# Patient Record
Sex: Male | Born: 1955 | Race: White | Hispanic: No | Marital: Married
Health system: Southern US, Community
[De-identification: ages and names within clinical notes are randomized; demographics above are authoritative.]

---

## 2005-10-16 ENCOUNTER — Ambulatory Visit: Payer: Self-pay | Admitting: Chiropractor

## 2006-09-06 ENCOUNTER — Emergency Department: Payer: Self-pay

## 2006-12-28 ENCOUNTER — Ambulatory Visit: Payer: Self-pay | Admitting: Urology

## 2007-07-06 ENCOUNTER — Ambulatory Visit: Payer: Self-pay | Admitting: Urology

## 2008-02-21 ENCOUNTER — Ambulatory Visit: Payer: Self-pay | Admitting: Gastroenterology

## 2008-10-23 ENCOUNTER — Ambulatory Visit: Payer: Self-pay

## 2011-09-12 ENCOUNTER — Emergency Department: Payer: Self-pay | Admitting: Emergency Medicine

## 2011-11-05 ENCOUNTER — Ambulatory Visit: Payer: Self-pay | Admitting: Otolaryngology

## 2011-11-05 LAB — CREATININE, SERUM: Creatinine: 1.12 mg/dL (ref 0.60–1.30)

## 2012-01-07 ENCOUNTER — Ambulatory Visit: Payer: Self-pay | Admitting: Neurology

## 2012-05-26 ENCOUNTER — Ambulatory Visit: Payer: Self-pay | Admitting: Urology

## 2012-06-03 ENCOUNTER — Ambulatory Visit: Payer: Self-pay | Admitting: Urology

## 2014-11-08 ENCOUNTER — Other Ambulatory Visit: Payer: Self-pay | Admitting: Medical

## 2014-11-08 DIAGNOSIS — R413 Other amnesia: Secondary | ICD-10-CM

## 2014-11-08 DIAGNOSIS — D6859 Other primary thrombophilia: Secondary | ICD-10-CM

## 2014-11-14 ENCOUNTER — Ambulatory Visit
Admission: RE | Admit: 2014-11-14 | Discharge: 2014-11-14 | Disposition: A | Discharge status: Home / Self Care | Payer: BLUE CROSS/BLUE SHIELD | Source: Ambulatory Visit | Attending: Medical | Admitting: Medical

## 2014-11-14 DIAGNOSIS — D6859 Other primary thrombophilia: Secondary | ICD-10-CM | POA: Insufficient documentation

## 2014-11-14 DIAGNOSIS — J322 Chronic ethmoidal sinusitis: Secondary | ICD-10-CM | POA: Insufficient documentation

## 2014-11-14 DIAGNOSIS — J320 Chronic maxillary sinusitis: Secondary | ICD-10-CM | POA: Diagnosis not present

## 2014-11-14 DIAGNOSIS — R413 Other amnesia: Secondary | ICD-10-CM | POA: Insufficient documentation

## 2015-10-31 ENCOUNTER — Other Ambulatory Visit: Payer: Self-pay | Admitting: Neurology

## 2015-12-10 ENCOUNTER — Other Ambulatory Visit: Payer: Self-pay | Admitting: Neurology

## 2016-01-03 ENCOUNTER — Other Ambulatory Visit: Payer: Self-pay | Admitting: Neurology

## 2016-01-03 DIAGNOSIS — R911 Solitary pulmonary nodule: Secondary | ICD-10-CM

## 2016-01-03 DIAGNOSIS — R918 Other nonspecific abnormal finding of lung field: Secondary | ICD-10-CM

## 2016-01-13 ENCOUNTER — Ambulatory Visit: Payer: BLUE CROSS/BLUE SHIELD

## 2016-01-13 ENCOUNTER — Ambulatory Visit
Admission: RE | Admit: 2016-01-13 | Discharge: 2016-01-13 | Disposition: A | Discharge status: Home / Self Care | Payer: BLUE CROSS/BLUE SHIELD | Source: Ambulatory Visit | Attending: Neurology | Admitting: Neurology

## 2016-01-13 DIAGNOSIS — R918 Other nonspecific abnormal finding of lung field: Secondary | ICD-10-CM | POA: Diagnosis not present

## 2016-01-13 DIAGNOSIS — I712 Thoracic aortic aneurysm, without rupture: Secondary | ICD-10-CM | POA: Diagnosis not present

## 2016-01-13 DIAGNOSIS — R911 Solitary pulmonary nodule: Secondary | ICD-10-CM

## 2017-07-09 ENCOUNTER — Other Ambulatory Visit: Payer: Self-pay | Admitting: Specialist

## 2017-07-09 DIAGNOSIS — R918 Other nonspecific abnormal finding of lung field: Secondary | ICD-10-CM

## 2017-07-19 ENCOUNTER — Ambulatory Visit: Payer: BLUE CROSS/BLUE SHIELD

## 2017-08-11 ENCOUNTER — Other Ambulatory Visit: Payer: Self-pay | Admitting: Specialist

## 2017-08-11 DIAGNOSIS — I7121 Aneurysm of the ascending aorta, without rupture: Secondary | ICD-10-CM

## 2017-08-11 DIAGNOSIS — I712 Thoracic aortic aneurysm, without rupture: Secondary | ICD-10-CM

## 2017-08-19 ENCOUNTER — Ambulatory Visit
Admission: RE | Admit: 2017-08-19 | Discharge: 2017-08-19 | Disposition: A | Discharge status: Home / Self Care | Payer: BLUE CROSS/BLUE SHIELD | Source: Ambulatory Visit | Attending: Specialist | Admitting: Specialist

## 2017-09-18 ENCOUNTER — Other Ambulatory Visit: Payer: Self-pay

## 2017-09-18 ENCOUNTER — Emergency Department: Payer: BLUE CROSS/BLUE SHIELD

## 2017-09-18 ENCOUNTER — Emergency Department
Admission: EM | Admit: 2017-09-18 | Discharge: 2017-09-18 | Disposition: A | Discharge status: Home / Self Care | Payer: BLUE CROSS/BLUE SHIELD | Attending: Student in an Organized Health Care Education/Training Program | Admitting: Student in an Organized Health Care Education/Training Program

## 2017-09-18 DIAGNOSIS — E860 Dehydration: Secondary | ICD-10-CM | POA: Diagnosis not present

## 2017-09-18 DIAGNOSIS — R55 Syncope and collapse: Secondary | ICD-10-CM | POA: Diagnosis not present

## 2017-09-18 DIAGNOSIS — R42 Dizziness and giddiness: Secondary | ICD-10-CM | POA: Diagnosis present

## 2017-09-18 LAB — COMPREHENSIVE METABOLIC PANEL
ALBUMIN: 4.3 g/dL (ref 3.5–5.0)
ALK PHOS: 45 U/L (ref 38–126)
ALT: 24 U/L (ref 17–63)
ANION GAP: 12 (ref 5–15)
AST: 32 U/L (ref 15–41)
BUN: 27 mg/dL — ABNORMAL HIGH (ref 6–20)
CALCIUM: 8.6 mg/dL — AB (ref 8.9–10.3)
CO2: 22 mmol/L (ref 22–32)
Chloride: 103 mmol/L (ref 101–111)
Creatinine, Ser: 1.17 mg/dL (ref 0.61–1.24)
GFR calc Af Amer: >60 mL/min (ref >60)
GFR calc non Af Amer: >60 mL/min (ref >60)
GLUCOSE: 145 mg/dL — AB (ref 65–99)
Potassium: 3.1 mmol/L — ABNORMAL LOW (ref 3.5–5.1)
SODIUM: 137 mmol/L (ref 135–145)
Total Bilirubin: 1.1 mg/dL (ref 0.3–1.2)
Total Protein: 6.9 g/dL (ref 6.5–8.1)

## 2017-09-18 LAB — CBC
HCT: 41.4 % (ref 40.0–52.0)
HEMOGLOBIN: 14.2 g/dL (ref 13.0–18.0)
MCH: 30.9 pg (ref 26.0–34.0)
MCHC: 34.4 g/dL (ref 32.0–36.0)
MCV: 90 fL (ref 80.0–100.0)
Platelets: 226 10*3/uL (ref 150–440)
RBC: 4.6 MIL/uL (ref 4.40–5.90)
RDW: 13.8 % (ref 11.5–14.5)
WBC: 9.3 10*3/uL (ref 3.8–10.6)

## 2017-09-18 LAB — DIFFERENTIAL
Basophils Absolute: 0 10*3/uL (ref 0–0.1)
Basophils Relative: 0 %
EOS PCT: 1 %
Eosinophils Absolute: 0.1 10*3/uL (ref 0–0.7)
LYMPHS ABS: 1.3 10*3/uL (ref 1.0–3.6)
Lymphocytes Relative: 14 %
MONO ABS: 0.8 10*3/uL (ref 0.2–1.0)
MONOS PCT: 8 %
Neutro Abs: 7.2 10*3/uL — ABNORMAL HIGH (ref 1.4–6.5)
Neutrophils Relative %: 77 %

## 2017-09-18 LAB — TROPONIN I
Troponin I: <0.03 ng/mL (ref <0.03)
Troponin I: <0.03 ng/mL (ref <0.03)

## 2017-09-18 MED ORDER — SODIUM CHLORIDE 0.9 % IV BOLUS
1000.0000 mL | Freq: Once | INTRAVENOUS | Status: AC
  Administered 2017-09-18: 1000 mL via INTRAVENOUS

## 2017-09-18 NOTE — ED Provider Notes (Signed)
[facility] Emergency Department Provider Note    First MD Initiated Contact with Patient 09/18/17 1553     (approximate)  I have reviewed the triage vital signs and the nursing notes.   HISTORY  Chief Complaint Weakness (pt states at work and started feeling weak and dizzy/pt states he felt heavy all over/denies cp/sob/n/)    HPI Tanner Bates is a 62 y.o. male presents with chief complaint of lightheadedness feeling that he was about to faint while the patient was at work.  States he works inside and outside.  Feels that he has been dehydrated.  Started feeling just with generalized malaise and weakness.  Felt heavy all over and that he needed to sit down.  He sat down with some improvement in symptoms.  EMS was called.  He denied any chest pain or shortness of breath.  Denies any numbness or tingling.  EMS found the patient was orthostatic.  States he has had decreased take over the past 2 days.    History reviewed. No pertinent past medical history. No family history on file. History reviewed. No pertinent surgical history. There are no active problems to display for this patient.     Prior to Admission medications   Not on File    Allergies Patient has no allergy information on record.    Social History Social History   Tobacco Use  . Smoking status: Not on file  Substance Use Topics  . Alcohol use: Not on file  . Drug use: Not on file    Review of Systems Patient denies headaches, rhinorrhea, blurry vision, numbness, shortness of breath, chest pain, edema, cough, abdominal pain, nausea, vomiting, diarrhea, dysuria, fevers, rashes or hallucinations unless otherwise stated above in HPI. ____________________________________________   PHYSICAL EXAM:  VITAL SIGNS: Vitals:   09/18/17 1930 09/18/17 2000  BP: 139/85 135/85  Pulse:    Resp: 13 13  Temp:    SpO2:      Constitutional: Alert and oriented.  Eyes: Conjunctivae are  normal.  Head: Atraumatic. Nose: No congestion/rhinnorhea. Mouth/Throat: Mucous membranes are moist.   Neck: No stridor. Painless ROM.  Cardiovascular: Normal rate, regular rhythm. Grossly normal heart sounds.  Good peripheral circulation. Respiratory: Normal respiratory effort.  No retractions. Lungs CTAB. Gastrointestinal: Soft and nontender. No distention. No abdominal bruits. No CVA tenderness. Genitourinary:  Musculoskeletal: No lower extremity tenderness nor edema.  No joint effusions. Neurologic:  Normal speech and language. No gross focal neurologic deficits are appreciated. No facial droop Skin:  Skin is warm, dry and intact. No rash noted. Psychiatric: Mood and affect are normal. Speech and behavior are normal.  ____________________________________________   LABS (all labs ordered are listed, but only abnormal results are displayed)  Results for orders placed or performed during the hospital encounter of 09/18/17 (from the past 24 hour(s))  Troponin I     Status: None   Collection Time: 09/18/17  3:55 PM  Result Value Ref Range   Troponin I <0.03 <0.03 ng/mL  Comprehensive metabolic panel     Status: Abnormal   Collection Time: 09/18/17  3:55 PM  Result Value Ref Range   Sodium 137 135 - 145 mmol/L   Potassium 3.1 (L) 3.5 - 5.1 mmol/L   Chloride 103 101 - 111 mmol/L   CO2 22 22 - 32 mmol/L   Glucose, Bld 145 (H) 65 - 99 mg/dL   BUN 27 (H) 6 - 20 mg/dL   Creatinine, Ser 1.611.17 0.61 - 1.24  mg/dL   Calcium 8.6 (L) 8.9 - 10.3 mg/dL   Total Protein 6.9 6.5 - 8.1 g/dL   Albumin 4.3 3.5 - 5.0 g/dL   AST 32 15 - 41 U/L   ALT 24 17 - 63 U/L   Alkaline Phosphatase 45 38 - 126 U/L   Total Bilirubin 1.1 0.3 - 1.2 mg/dL   GFR calc non Af Amer >60 >60 mL/min   GFR calc Af Amer >60 >60 mL/min   Anion gap 12 5 - 15  CBC     Status: None   Collection Time: 09/18/17  4:25 PM  Result Value Ref Range   WBC 9.3 3.8 - 10.6 K/uL   RBC 4.60 4.40 - 5.90 MIL/uL   Hemoglobin 14.2 13.0 -  18.0 g/dL   HCT 16.1 09.6 - 04.5 %   MCV 90.0 80.0 - 100.0 fL   MCH 30.9 26.0 - 34.0 pg   MCHC 34.4 32.0 - 36.0 g/dL   RDW 40.9 81.1 - 91.4 %   Platelets 226 150 - 440 K/uL  Differential     Status: Abnormal   Collection Time: 09/18/17  4:25 PM  Result Value Ref Range   Neutrophils Relative % 77 %   Neutro Abs 7.2 (H) 1.4 - 6.5 K/uL   Lymphocytes Relative 14 %   Lymphs Abs 1.3 1.0 - 3.6 K/uL   Monocytes Relative 8 %   Monocytes Absolute 0.8 0.2 - 1.0 K/uL   Eosinophils Relative 1 %   Eosinophils Absolute 0.1 0 - 0.7 K/uL   Basophils Relative 0 %   Basophils Absolute 0.0 0 - 0.1 K/uL  Troponin I     Status: None   Collection Time: 09/18/17  7:41 PM  Result Value Ref Range   Troponin I <0.03 <0.03 ng/mL   ____________________________________________  EKG My review and personal interpretation at Time: 15:46   Indication: near syncope  Rate: 100  Rhythm: sinus Axis: normal Other: normal intervals, no stemi, no pre-excitation syndrome ____________________________________________  RADIOLOGY  I personally reviewed all radiographic images ordered to evaluate for the above acute complaints and reviewed radiology reports and findings.  These findings were personally discussed with the patient.  Please see medical record for radiology report.  ____________________________________________   PROCEDURES  Procedure(s) performed:  Procedures    Critical Care performed: no ____________________________________________   INITIAL IMPRESSION / ASSESSMENT AND PLAN / ED COURSE  Pertinent labs & imaging results that were available during my care of the patient were reviewed by me and considered in my medical decision making (see chart for details).   DDX: Dehydration, vasovagal, dysrhythmia, ACS, orthostasis, heat syncope, acute blood loss anemia  Tanner Bates is a 62 y.o. who presents to the ED with symptoms as described above.  Symptoms seem very suspicious for orthostasis,  dehydration.  Denies any chest pain or pressure.  EKG shows no evidence of acute ischemic changes.  Is a nonfocal neuro exam.  Not clinically consistent with seizures or CVA.  Patient states that he felt much improved after just having oral hydration and does endorse decreased p.o. intake over the past 2 days while at work therefore I do have a high suspicion for dehydration and will give IV fluids.  Based on his age and risk factors will order serial enzymes to further rule out cardiac syncope.  Keep patient on cardiac monitor.  Clinical Course as of Sep 18 2099  Sat Sep 18, 2017  1757 Patient reassessed feeling much improved.  Tolerating oral hydration.  States that he does think that he got dehydrated.  I informed patient that I would like to keep him in the ER for an additional observation.  For serial enzyme.  Repeat neuro exam nonfocal.  Patient agreeable to plan.   [PR]    Clinical Course User Index [PR] Willy Eddy, MD   Patient observed for total 4-1/2 hours in the ER.  He tolerated oral hydration.  Repeat troponin negative.  Patient ambulated with steady gait and feels well.  At this point I do believe he stable and appropriate for outpatient follow-up.  As part of my medical decision making, I reviewed the following data within the electronic MEDICAL RECORD NUMBER Nursing notes reviewed and incorporated, Labs reviewed, notes from prior ED visits.   ____________________________________________   FINAL CLINICAL IMPRESSION(S) / ED DIAGNOSES  Final diagnoses:  Near syncope  Dehydration      NEW MEDICATIONS STARTED DURING THIS VISIT:  There are no discharge medications for this patient.    Note:  This document was prepared using Dragon voice recognition software and may include unintentional dictation errors.    Willy Eddy, MD 09/18/17 2101

## 2017-09-18 NOTE — ED Notes (Signed)
Pt updated on treatment plan. Pt verbalizes understanding.  

## 2017-09-18 NOTE — ED Notes (Signed)
bp laying 126/77 85/sitting 126/77/standing122/86 97/ denies dizziness with changing positions

## 2018-01-20 ENCOUNTER — Ambulatory Visit: Payer: BLUE CROSS/BLUE SHIELD | Attending: Specialist

## 2018-01-20 DIAGNOSIS — G4733 Obstructive sleep apnea (adult) (pediatric): Secondary | ICD-10-CM | POA: Insufficient documentation

## 2018-01-20 DIAGNOSIS — G4761 Periodic limb movement disorder: Secondary | ICD-10-CM | POA: Insufficient documentation

## 2018-02-04 ENCOUNTER — Ambulatory Visit: Payer: BLUE CROSS/BLUE SHIELD | Attending: Specialist

## 2018-02-04 DIAGNOSIS — G4761 Periodic limb movement disorder: Secondary | ICD-10-CM | POA: Insufficient documentation

## 2018-02-04 DIAGNOSIS — G4733 Obstructive sleep apnea (adult) (pediatric): Secondary | ICD-10-CM | POA: Diagnosis not present

## 2018-08-10 ENCOUNTER — Other Ambulatory Visit: Payer: Self-pay | Admitting: Family Medicine

## 2018-08-10 DIAGNOSIS — I7121 Aneurysm of the ascending aorta, without rupture: Secondary | ICD-10-CM

## 2018-08-10 DIAGNOSIS — R918 Other nonspecific abnormal finding of lung field: Secondary | ICD-10-CM

## 2018-08-10 DIAGNOSIS — I712 Thoracic aortic aneurysm, without rupture: Secondary | ICD-10-CM

## 2018-09-05 ENCOUNTER — Ambulatory Visit
Admission: RE | Admit: 2018-09-05 | Discharge: 2018-09-05 | Disposition: A | Discharge status: Home / Self Care | Payer: No Typology Code available for payment source | Source: Ambulatory Visit | Attending: Family Medicine | Admitting: Family Medicine

## 2018-09-05 ENCOUNTER — Other Ambulatory Visit: Payer: Self-pay

## 2018-09-05 DIAGNOSIS — R918 Other nonspecific abnormal finding of lung field: Secondary | ICD-10-CM | POA: Insufficient documentation

## 2018-09-05 DIAGNOSIS — I7121 Aneurysm of the ascending aorta, without rupture: Secondary | ICD-10-CM

## 2018-09-05 DIAGNOSIS — I712 Thoracic aortic aneurysm, without rupture: Secondary | ICD-10-CM | POA: Insufficient documentation

## 2018-09-05 MED ORDER — IOHEXOL 350 MG/ML SOLN
75.0000 mL | Freq: Once | INTRAVENOUS | Status: AC | PRN
  Administered 2018-09-05: 75 mL via INTRAVENOUS

## 2018-09-08 ENCOUNTER — Other Ambulatory Visit: Payer: Self-pay | Admitting: Family Medicine

## 2018-09-08 DIAGNOSIS — I7121 Aneurysm of the ascending aorta, without rupture: Secondary | ICD-10-CM

## 2018-09-08 DIAGNOSIS — I77810 Thoracic aortic ectasia: Secondary | ICD-10-CM

## 2018-09-08 DIAGNOSIS — I712 Thoracic aortic aneurysm, without rupture: Secondary | ICD-10-CM

## 2019-09-05 ENCOUNTER — Ambulatory Visit: Admission: RE | Admit: 2019-09-05 | Payer: No Typology Code available for payment source | Source: Ambulatory Visit

## 2019-09-07 ENCOUNTER — Other Ambulatory Visit: Payer: Self-pay

## 2019-09-07 ENCOUNTER — Ambulatory Visit
Admission: RE | Admit: 2019-09-07 | Discharge: 2019-09-07 | Disposition: A | Discharge status: Home / Self Care | Payer: No Typology Code available for payment source | Source: Ambulatory Visit | Attending: Family Medicine | Admitting: Family Medicine

## 2019-09-07 DIAGNOSIS — I77810 Thoracic aortic ectasia: Secondary | ICD-10-CM | POA: Insufficient documentation

## 2019-09-07 DIAGNOSIS — I7121 Aneurysm of the ascending aorta, without rupture: Secondary | ICD-10-CM

## 2019-09-07 DIAGNOSIS — I712 Thoracic aortic aneurysm, without rupture: Secondary | ICD-10-CM | POA: Insufficient documentation

## 2020-04-11 IMAGING — CT CT ANGIOGRAPHY CHEST
2 of 6 series · 13 of 36 positions shown · IV contrast (omnipaque)
Comparison: CT chest 01/13/2016

CLINICAL DATA: Evaluate ascending aorta.

EXAM:
CT ANGIOGRAPHY CHEST WITH CONTRAST
TECHNIQUE: Multidetector CT imaging of the chest was performed using the
standard protocol during bolus administration of intravenous
contrast. Multiplanar CT image reconstructions and MIPs were
obtained to evaluate the vascular anatomy.
CONTRAST:  75mL OMNIPAQUE IOHEXOL 350 MG/ML SOLN

[Series 5: axial arterial cta thorax · axial · arterial · 0.81mm/px · z∈[-1112,-804]mm · 12 of 183 slices shown]
[im 15/183  lung]
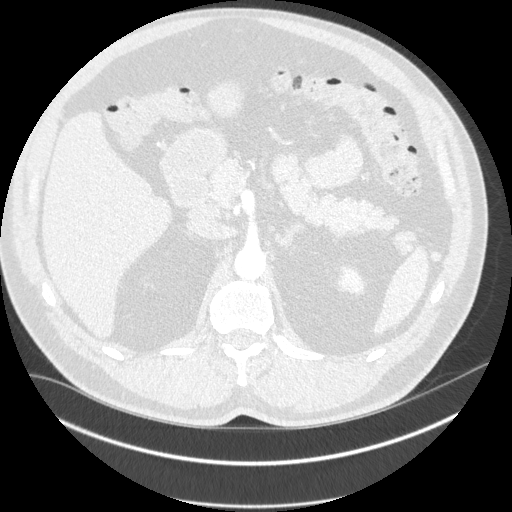
[im 29/183  mediastinal]
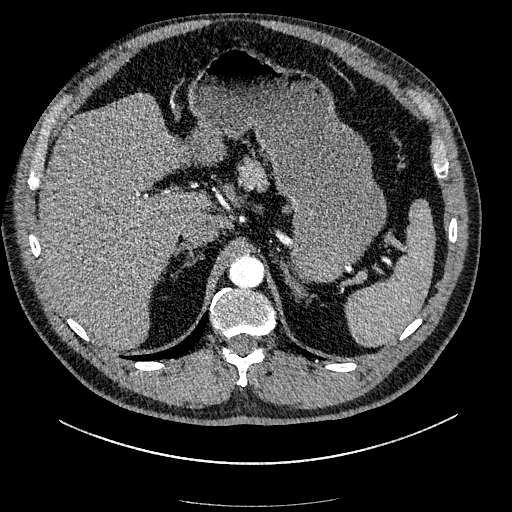
[im 43/183  lung]
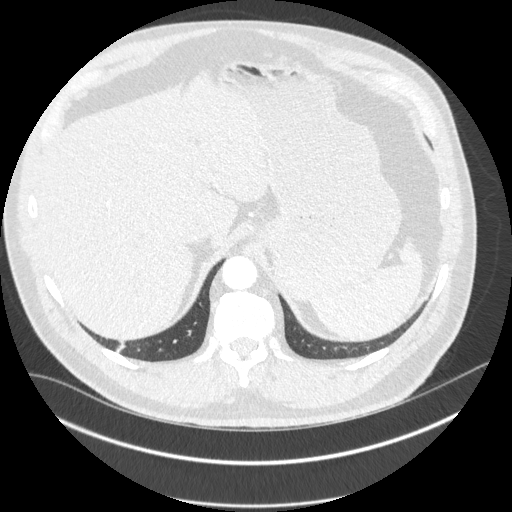
[im 57/183  mediastinal]
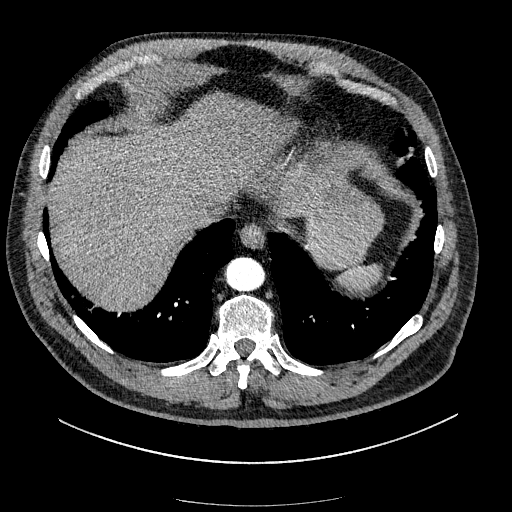
[im 71/183  lung]
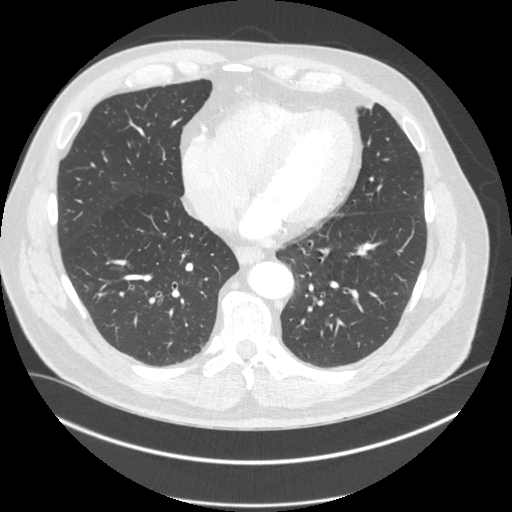
[im 85/183  mediastinal]
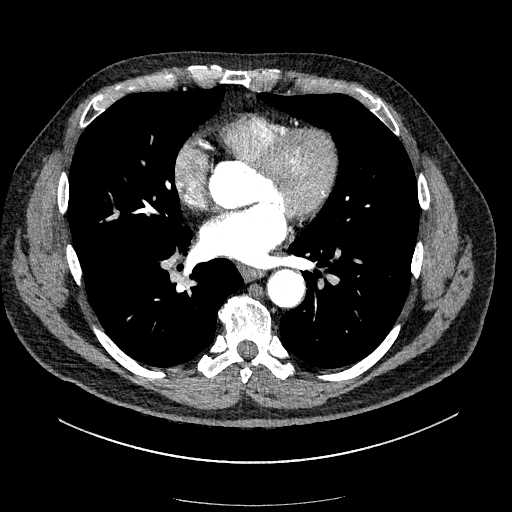
[im 99/183  lung]
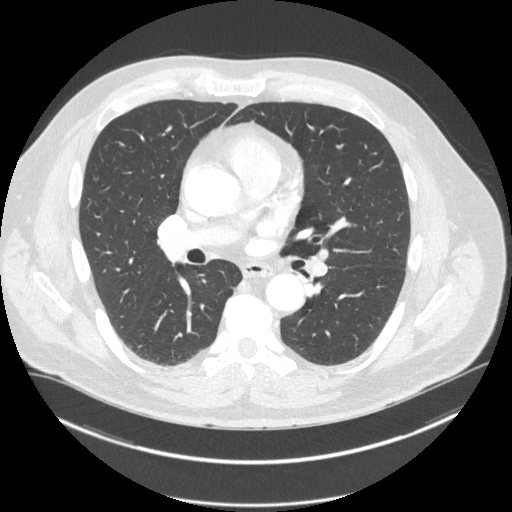
[im 113/183  mediastinal]
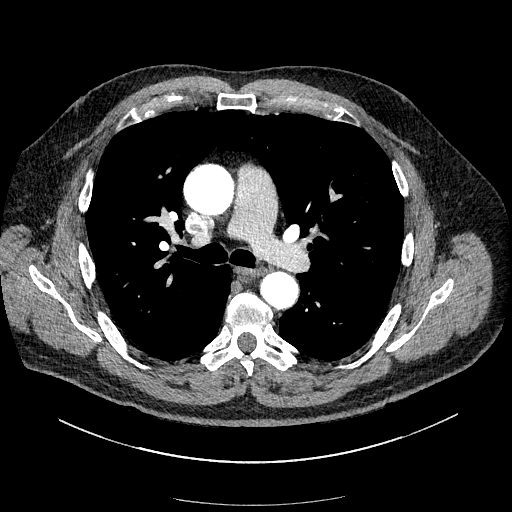
[im 127/183  lung]
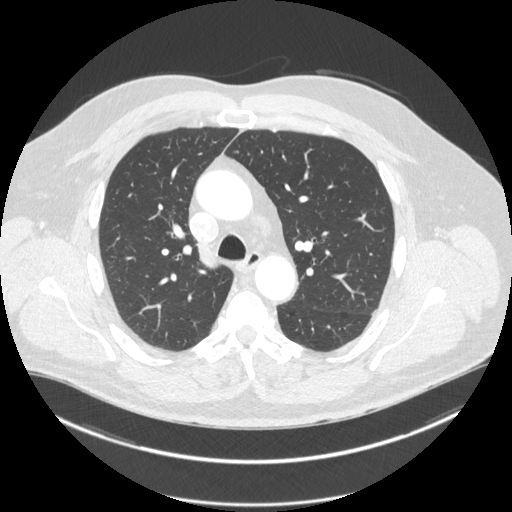
[im 141/183  mediastinal]
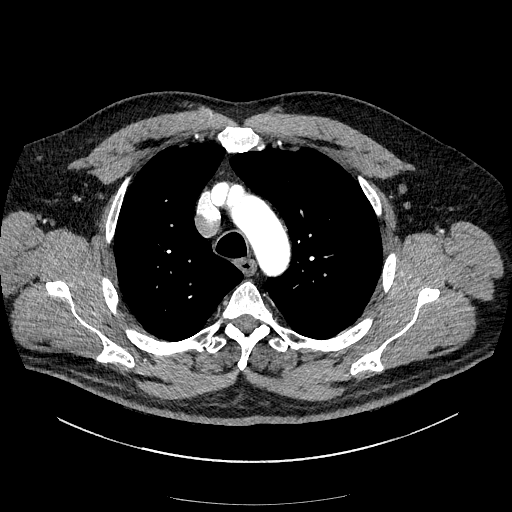
[im 155/183  lung]
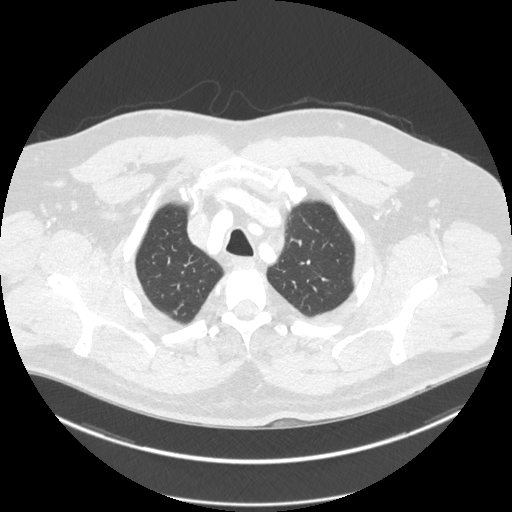
[im 169/183  mediastinal]
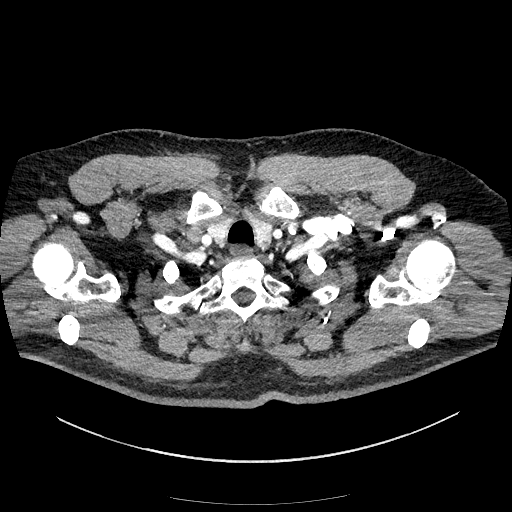

[Series 8: cor st cta thorax · coronal · 0.71mm/px · 1 of 171 slices shown]
[im 86/171  mediastinal]
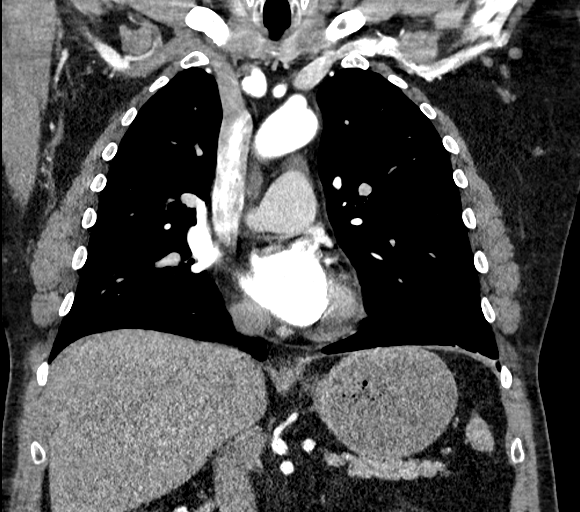

[13 of 36 positions shown; findings below may reference images not displayed]

FINDINGS: Cardiovascular: Ascending aorta is upper limits normal diameter 39
mm. Great vessels are normal. Descending thoracic aorta normal.

Coronary artery calcification and aortic atherosclerotic
calcification.

Mediastinum/Nodes: No axillary supraclavicular adenopathy. No
mediastinal hilar adenopathy.

Lungs/Pleura: Mild basilar atelectasis at the LEFT lung base.

Small nodule in the LEFT upper lobe measures 4 mm and not changed
from comparison exam. Nodule at the LEFT lung base measuring 7 mm
also unchanged (image 123/6). No new pulmonary nodules.

Upper Abdomen: Limited view of the liver, kidneys, pancreas are
unremarkable. Normal adrenal glands.

Musculoskeletal: No aggressive osseous lesion.

Review of the MIP images confirms the above findings.
IMPRESSION: 1. Ascending thoracic aorta is upper limits of normal at 39 mm.
2. Stable LEFT lung pulmonary nodules. Nodules stable over 2 years
of follow-up is consistent benign etiology per Fleischner criteria.

## 2021-04-13 IMAGING — US US AORTA SCREENING (MEDICARE)
2 series · 14 of 25 positions shown · non-contrast
Comparison: Chest CT-09/05/2018

CLINICAL DATA: Ascending thoracic aortic dilatation. Evaluate for
abdominal aortic aneurysm.

EXAM:
ULTRASOUND OF ABDOMINAL AORTA
TECHNIQUE: Ultrasound examination of the abdominal aorta and proximal common
iliac arteries was performed to evaluate for aneurysm. Additional
color and Doppler images of the distal aorta were obtained to
document patency.

[Series 1: us aorta duplex limited · 13 of 31 slices shown (1 of 2)]
[im 1/31]
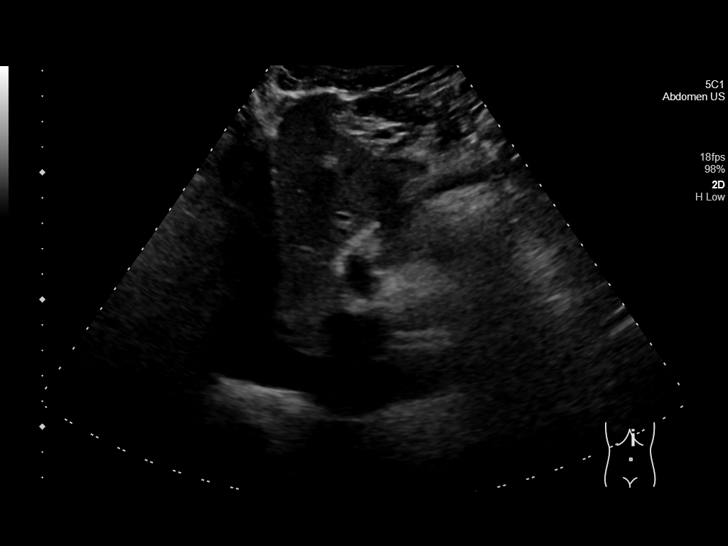
[im 3/31]
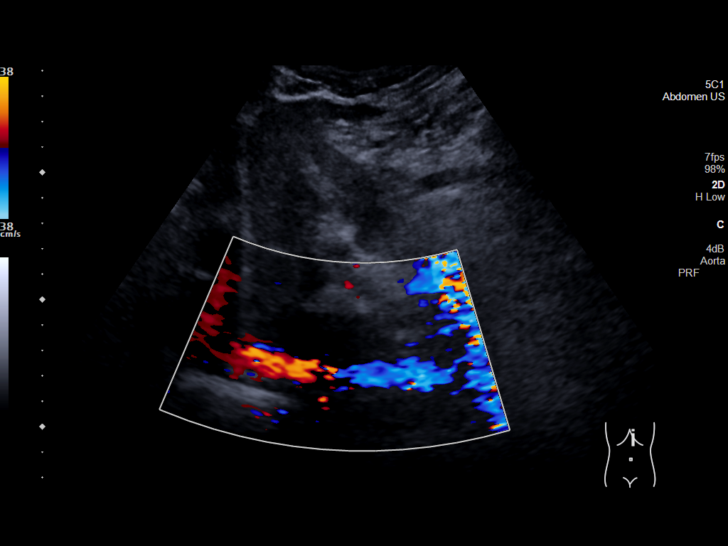
[im 6/31]
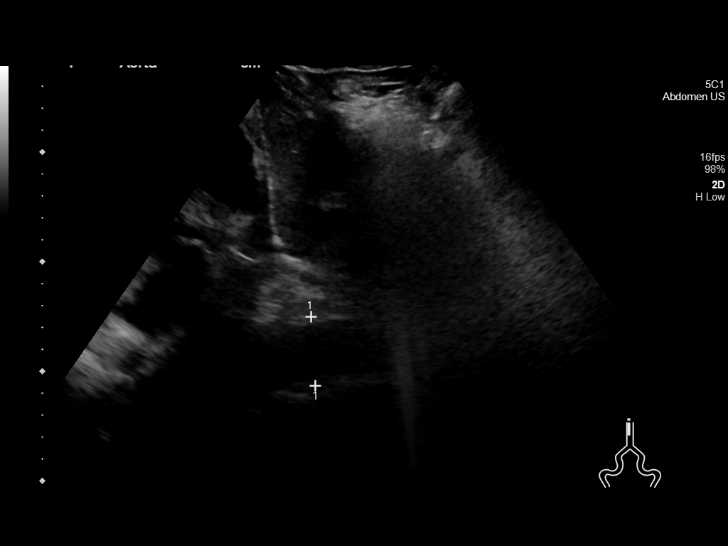
[im 9/31]
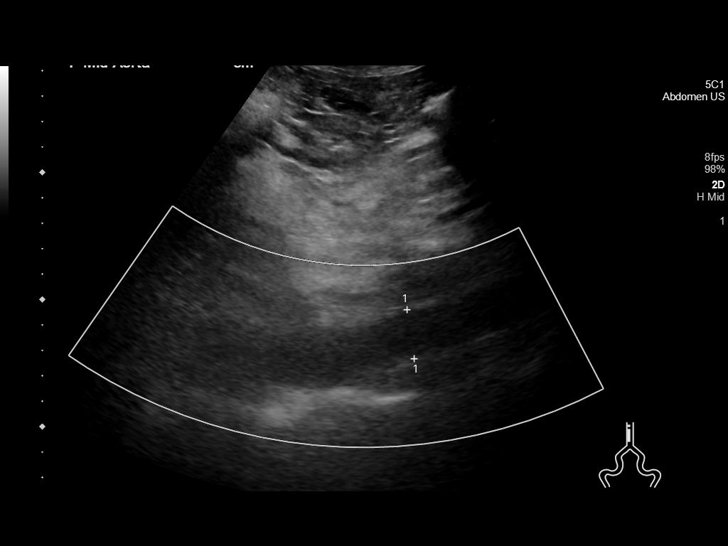
[im 11/31]
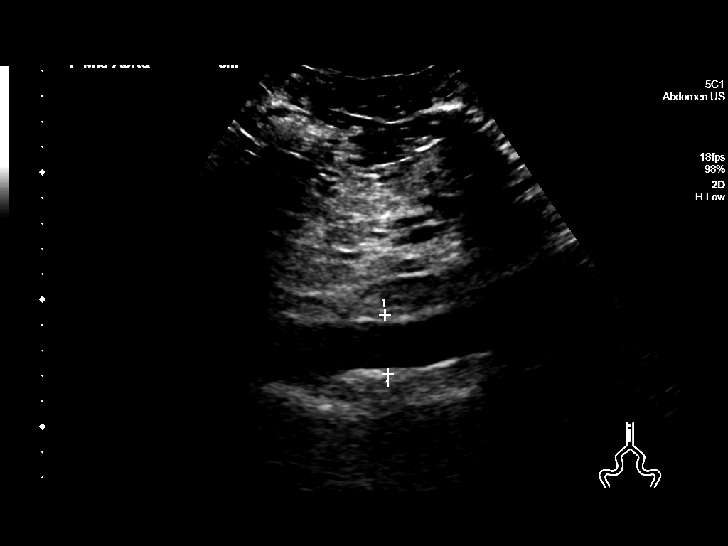
[im 13/31]
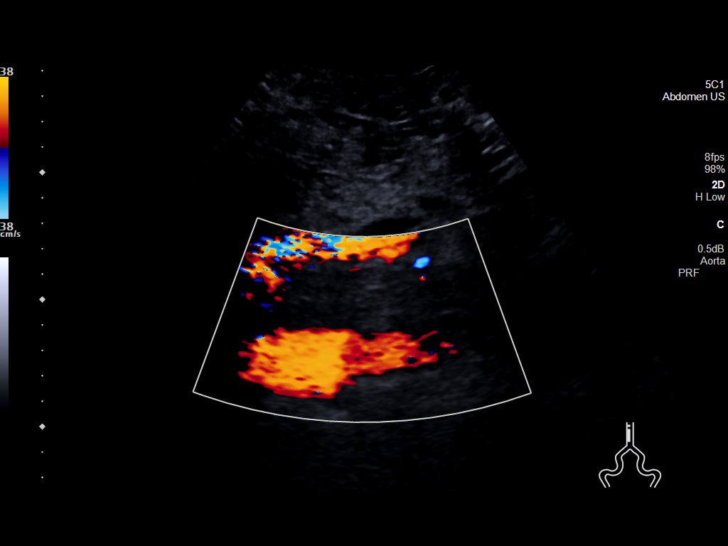
[im 15/31]
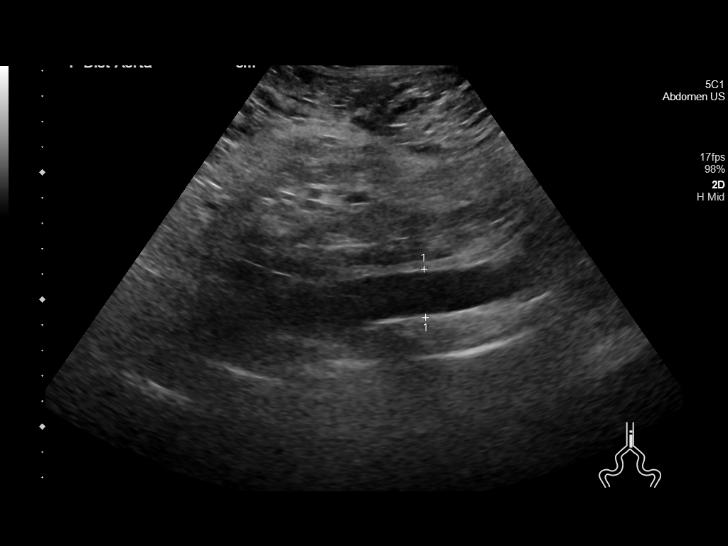
[im 18/31]
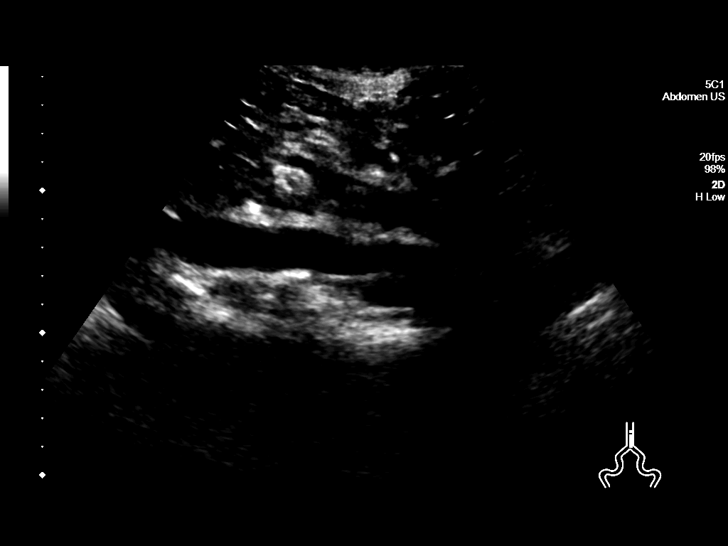
[im 21/31]
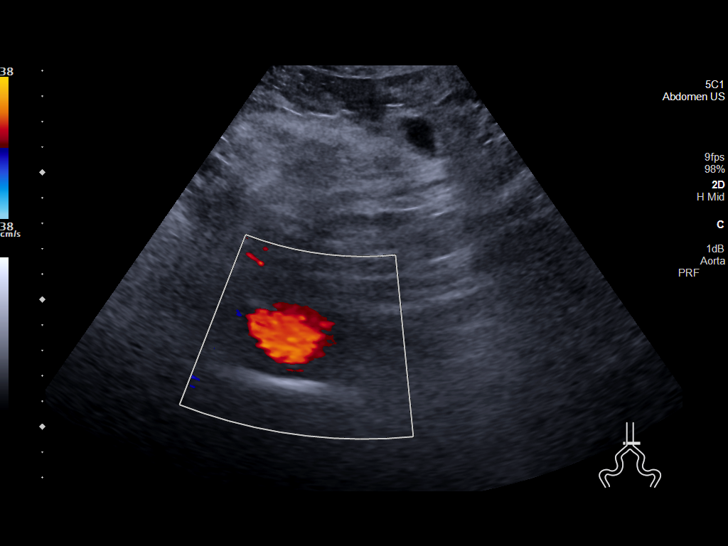
[im 22/31]
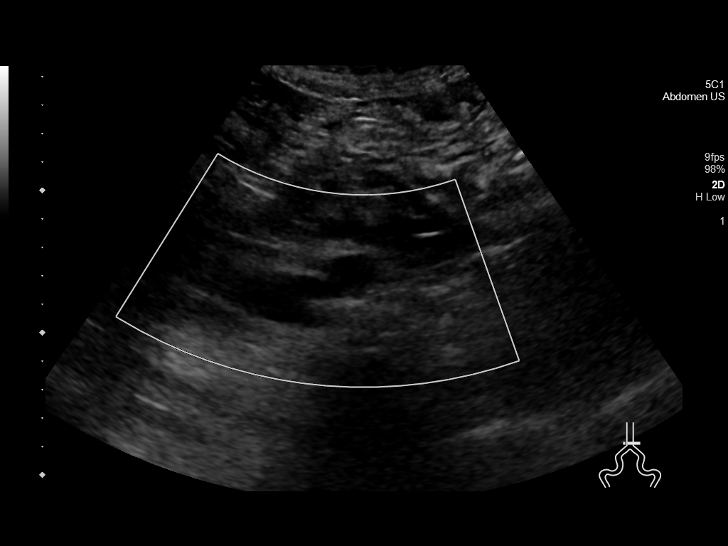
[im 25/31]
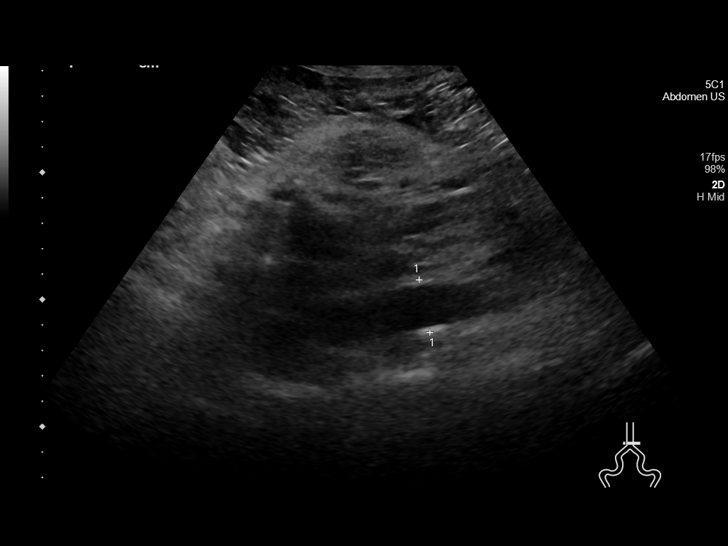
[im 28/31]
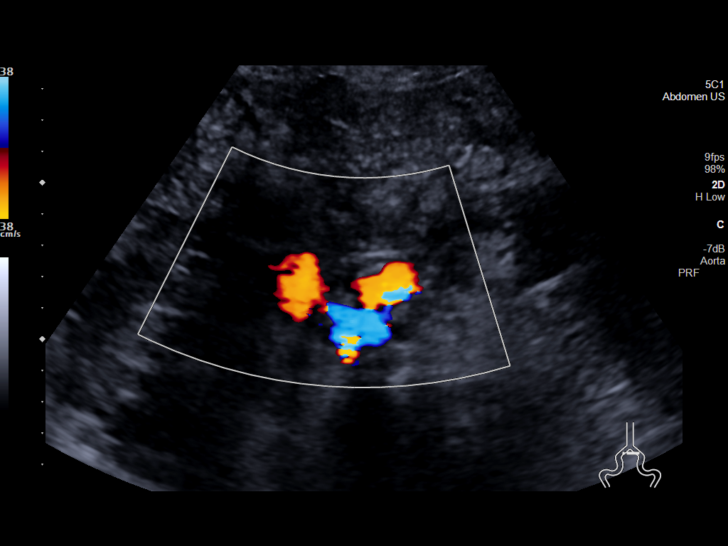
[im 31/31]
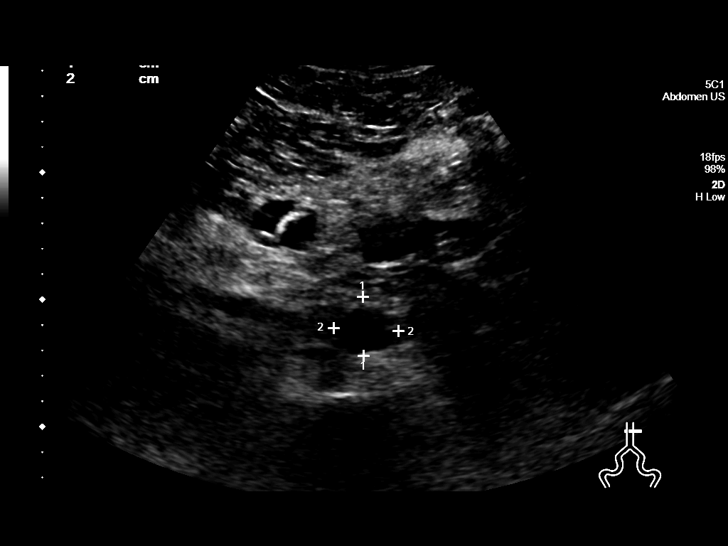

[Series 1001: us aorta duplex limited · 1 of 2 slices shown (2 of 2)]
[im 2/2]
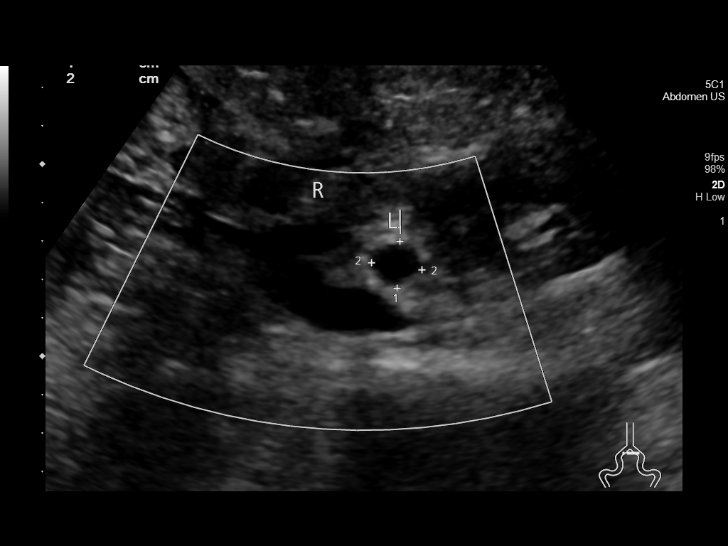

[14 of 25 positions shown; findings below may reference images not displayed]

FINDINGS: Abdominal aortic measurements as follows:

Proximal:  3.2 x 3.2 cm

Mid:  2.3 x 2.1 cm

Distal:  1.8 x 1.8 cm
Patent: Yes, peak systolic velocity is 126 cm/s

Right common iliac artery: 1.4 x 1.0 cm

Left common iliac artery: 1.3 x 1.2 cm
IMPRESSION: No evidence of abdominal aortic aneurysm.

## 2024-02-16 ENCOUNTER — Other Ambulatory Visit: Payer: Self-pay | Admitting: Family Medicine

## 2024-02-16 DIAGNOSIS — M5416 Radiculopathy, lumbar region: Secondary | ICD-10-CM

## 2024-02-25 ENCOUNTER — Ambulatory Visit
Admission: RE | Admit: 2024-02-25 | Discharge: 2024-02-25 | Disposition: A | Discharge status: Home / Self Care | Source: Ambulatory Visit | Attending: Family Medicine | Admitting: Family Medicine

## 2024-02-25 DIAGNOSIS — M5416 Radiculopathy, lumbar region: Secondary | ICD-10-CM | POA: Insufficient documentation
